# Patient Record
Sex: Male | Born: 1935 | Hispanic: No | Marital: Single
Health system: Southern US, Community
[De-identification: ages and names within clinical notes are randomized; demographics above are authoritative.]

---

## 2017-04-21 ENCOUNTER — Inpatient Hospital Stay
Admission: RE | Admit: 2017-04-21 | Discharge: 2017-05-28 | Disposition: A | Discharge status: Skilled Nursing Facility | Payer: Medicaid Other | Source: Ambulatory Visit | Attending: Internal Medicine | Admitting: Internal Medicine

## 2017-04-21 ENCOUNTER — Other Ambulatory Visit (HOSPITAL_COMMUNITY): Payer: Medicaid Other

## 2017-04-21 DIAGNOSIS — K567 Ileus, unspecified: Secondary | ICD-10-CM

## 2017-04-21 DIAGNOSIS — R0603 Acute respiratory distress: Secondary | ICD-10-CM

## 2017-04-21 DIAGNOSIS — R1110 Vomiting, unspecified: Secondary | ICD-10-CM

## 2017-04-21 DIAGNOSIS — Z431 Encounter for attention to gastrostomy: Secondary | ICD-10-CM

## 2017-04-21 DIAGNOSIS — Z931 Gastrostomy status: Secondary | ICD-10-CM

## 2017-04-21 DIAGNOSIS — Z0189 Encounter for other specified special examinations: Secondary | ICD-10-CM

## 2017-04-21 DIAGNOSIS — D72829 Elevated white blood cell count, unspecified: Secondary | ICD-10-CM

## 2017-04-21 DIAGNOSIS — J9690 Respiratory failure, unspecified, unspecified whether with hypoxia or hypercapnia: Secondary | ICD-10-CM

## 2017-04-21 LAB — BLOOD GAS, ARTERIAL
ACID-BASE EXCESS: 0 mmol/L (ref 0.0–2.0)
Bicarbonate: 24.6 mmol/L (ref 20.0–28.0)
FIO2: 40
LHR: 16 {breaths}/min
O2 SAT: 98.7 %
PATIENT TEMPERATURE: 98.6
PCO2 ART: 42.9 mmHg (ref 32.0–48.0)
PEEP: 5 cmH2O
PH ART: 7.376 (ref 7.350–7.450)
Pressure control: 20 cmH2O
pO2, Arterial: 126 mmHg — ABNORMAL HIGH (ref 83.0–108.0)

## 2017-04-22 LAB — CBC WITH DIFFERENTIAL/PLATELET
BASOS PCT: 0 %
Basophils Absolute: 0 10*3/uL (ref 0.0–0.1)
EOS ABS: 0 10*3/uL (ref 0.0–0.7)
EOS PCT: 0 %
HCT: 29.5 % — ABNORMAL LOW (ref 39.0–52.0)
HEMOGLOBIN: 8.9 g/dL — AB (ref 13.0–17.0)
LYMPHS PCT: 14 %
Lymphs Abs: 1.7 10*3/uL (ref 0.7–4.0)
MCH: 24.9 pg — AB (ref 26.0–34.0)
MCHC: 30.2 g/dL (ref 30.0–36.0)
MCV: 82.4 fL (ref 78.0–100.0)
Monocytes Absolute: 0.6 10*3/uL (ref 0.1–1.0)
Monocytes Relative: 5 %
NEUTROS ABS: 9.7 10*3/uL — AB (ref 1.7–7.7)
NEUTROS PCT: 81 %
Platelets: 334 10*3/uL (ref 150–400)
RBC: 3.58 MIL/uL — ABNORMAL LOW (ref 4.22–5.81)
RDW: 20 % — ABNORMAL HIGH (ref 11.5–15.5)
WBC: 12 10*3/uL — ABNORMAL HIGH (ref 4.0–10.5)

## 2017-04-22 LAB — COMPREHENSIVE METABOLIC PANEL
ALBUMIN: 2.1 g/dL — AB (ref 3.5–5.0)
ALK PHOS: 75 U/L (ref 38–126)
ALT: 16 U/L — AB (ref 17–63)
AST: 16 U/L (ref 15–41)
Anion gap: 7 (ref 5–15)
BILIRUBIN TOTAL: 0.8 mg/dL (ref 0.3–1.2)
BUN: 36 mg/dL — AB (ref 6–20)
CALCIUM: 8.5 mg/dL — AB (ref 8.9–10.3)
CO2: 23 mmol/L (ref 22–32)
CREATININE: 1.08 mg/dL (ref 0.61–1.24)
Chloride: 104 mmol/L (ref 101–111)
GFR calc Af Amer: >60 mL/min (ref >60)
GLUCOSE: 110 mg/dL — AB (ref 65–99)
Potassium: 4.8 mmol/L (ref 3.5–5.1)
Sodium: 134 mmol/L — ABNORMAL LOW (ref 135–145)
TOTAL PROTEIN: 5.9 g/dL — AB (ref 6.5–8.1)

## 2017-04-22 LAB — TSH: TSH: 0.912 u[IU]/mL (ref 0.350–4.500)

## 2017-04-22 LAB — HEMOGLOBIN A1C
HEMOGLOBIN A1C: 5.9 % — AB (ref 4.8–5.6)
MEAN PLASMA GLUCOSE: 122.63 mg/dL

## 2017-04-22 LAB — PROTIME-INR
INR: 1.27
Prothrombin Time: 15.8 seconds — ABNORMAL HIGH (ref 11.4–15.2)

## 2017-04-22 LAB — PHOSPHORUS: Phosphorus: 4.4 mg/dL (ref 2.5–4.6)

## 2017-04-22 LAB — MAGNESIUM: MAGNESIUM: 2.2 mg/dL (ref 1.7–2.4)

## 2017-04-22 MED ORDER — SODIUM CHLORIDE 0.9 % IV SOLN
INTRAVENOUS | Status: DC
Start: ? — End: 2017-04-22

## 2017-04-22 MED ORDER — HYDRALAZINE HCL 20 MG/ML IJ SOLN
10.00 | INTRAMUSCULAR | Status: DC
Start: ? — End: 2017-04-22

## 2017-04-22 MED ORDER — ALBUTEROL SULFATE (2.5 MG/3ML) 0.083% IN NEBU
2.50 | INHALATION_SOLUTION | RESPIRATORY_TRACT | Status: DC
Start: ? — End: 2017-04-22

## 2017-04-22 MED ORDER — FERROUS SULFATE 300 (60 FE) MG/5ML PO SYRP
300.00 | ORAL_SOLUTION | ORAL | Status: DC
Start: 2017-04-21 — End: 2017-04-22

## 2017-04-22 MED ORDER — CHLORHEXIDINE GLUCONATE 0.12 % MT SOLN
15.00 | OROMUCOSAL | Status: DC
Start: 2017-04-21 — End: 2017-04-22

## 2017-04-22 MED ORDER — CLOTRIMAZOLE 1 % EX CREA
TOPICAL_CREAM | CUTANEOUS | Status: DC
Start: 2017-04-21 — End: 2017-04-22

## 2017-04-22 MED ORDER — BISACODYL 10 MG RE SUPP
10.00 | RECTAL | Status: DC
Start: 2017-04-22 — End: 2017-04-22

## 2017-04-22 MED ORDER — DEXTROSE 10 % IV SOLN
INTRAVENOUS | Status: DC
Start: ? — End: 2017-04-22

## 2017-04-22 MED ORDER — LABETALOL HCL 5 MG/ML IV SOLN
10.00 | INTRAVENOUS | Status: DC
Start: ? — End: 2017-04-22

## 2017-04-22 MED ORDER — GENERIC EXTERNAL MEDICATION
Status: DC
Start: ? — End: 2017-04-22

## 2017-04-22 MED ORDER — ONDANSETRON HCL 4 MG/2ML IJ SOLN
4.00 | INTRAMUSCULAR | Status: DC
Start: ? — End: 2017-04-22

## 2017-04-22 MED ORDER — DOCUSATE SODIUM 150 MG/15ML PO LIQD
100.00 | ORAL | Status: DC
Start: 2017-04-22 — End: 2017-04-22

## 2017-04-22 MED ORDER — AMIODARONE HCL 200 MG PO TABS
200.00 | ORAL_TABLET | ORAL | Status: DC
Start: 2017-04-22 — End: 2017-04-22

## 2017-04-22 MED ORDER — DEXMEDETOMIDINE HCL IN NACL 400 MCG/100ML IV SOLN
INTRAVENOUS | Status: DC
Start: ? — End: 2017-04-22

## 2017-04-22 MED ORDER — LORAZEPAM 2 MG/ML IJ SOLN
1.00 | INTRAMUSCULAR | Status: DC
Start: ? — End: 2017-04-22

## 2017-04-22 MED ORDER — NICOTINE 21 MG/24HR TD PT24
MEDICATED_PATCH | TRANSDERMAL | Status: DC
Start: 2017-04-22 — End: 2017-04-22

## 2017-04-22 MED ORDER — INSULIN LISPRO 100 UNIT/ML ~~LOC~~ SOLN
SUBCUTANEOUS | Status: DC
Start: ? — End: 2017-04-22

## 2017-04-22 MED ORDER — FAMOTIDINE 20 MG PO TABS
20.00 | ORAL_TABLET | ORAL | Status: DC
Start: 2017-04-21 — End: 2017-04-22

## 2017-04-22 MED ORDER — INSULIN LISPRO 100 UNIT/ML ~~LOC~~ SOLN
SUBCUTANEOUS | Status: DC
Start: 2017-04-21 — End: 2017-04-22

## 2017-04-22 MED ORDER — EZETIMIBE 10 MG PO TABS
10.00 | ORAL_TABLET | ORAL | Status: DC
Start: 2017-04-21 — End: 2017-04-22

## 2017-04-22 MED ORDER — GUAIFENESIN 100 MG/5ML PO LIQD
200.00 | ORAL | Status: DC
Start: 2017-04-21 — End: 2017-04-22

## 2017-04-22 MED ORDER — FENTANYL CITRATE-NACL 2.5-0.9 MG/250ML-% IV SOLN
INTRAVENOUS | Status: DC
Start: ? — End: 2017-04-22

## 2017-04-22 MED ORDER — ASPIRIN 81 MG PO CHEW
81.00 | CHEWABLE_TABLET | ORAL | Status: DC
Start: 2017-04-22 — End: 2017-04-22

## 2017-04-22 MED ORDER — VITAMIN B-12 1000 MCG PO TABS
ORAL_TABLET | ORAL | Status: DC
Start: 2017-04-22 — End: 2017-04-22

## 2017-04-22 MED ORDER — ACETAMINOPHEN 160 MG/5ML PO SUSP
650.00 | ORAL | Status: DC
Start: ? — End: 2017-04-22

## 2017-04-22 MED ORDER — INSULIN GLARGINE 100 UNIT/ML ~~LOC~~ SOLN
SUBCUTANEOUS | Status: DC
Start: 2017-04-21 — End: 2017-04-22

## 2017-04-23 LAB — URINALYSIS, ROUTINE W REFLEX MICROSCOPIC
BILIRUBIN URINE: NEGATIVE
GLUCOSE, UA: NEGATIVE mg/dL
HGB URINE DIPSTICK: NEGATIVE
Ketones, ur: NEGATIVE mg/dL
LEUKOCYTES UA: NEGATIVE
NITRITE: NEGATIVE
PROTEIN: 100 mg/dL — AB
Specific Gravity, Urine: 1.017 (ref 1.005–1.030)
pH: 5 (ref 5.0–8.0)

## 2017-04-23 LAB — BASIC METABOLIC PANEL
ANION GAP: 9 (ref 5–15)
BUN: 30 mg/dL — ABNORMAL HIGH (ref 6–20)
CO2: 27 mmol/L (ref 22–32)
Calcium: 9 mg/dL (ref 8.9–10.3)
Chloride: 103 mmol/L (ref 101–111)
Creatinine, Ser: 0.99 mg/dL (ref 0.61–1.24)
GFR calc non Af Amer: >60 mL/min (ref >60)
GLUCOSE: 172 mg/dL — AB (ref 65–99)
POTASSIUM: 4.7 mmol/L (ref 3.5–5.1)
Sodium: 139 mmol/L (ref 135–145)

## 2017-04-23 LAB — CBC
HEMATOCRIT: 33.8 % — AB (ref 39.0–52.0)
HEMOGLOBIN: 10.2 g/dL — AB (ref 13.0–17.0)
MCH: 25.3 pg — ABNORMAL LOW (ref 26.0–34.0)
MCHC: 30.2 g/dL (ref 30.0–36.0)
MCV: 83.9 fL (ref 78.0–100.0)
Platelets: 416 10*3/uL — ABNORMAL HIGH (ref 150–400)
RBC: 4.03 MIL/uL — AB (ref 4.22–5.81)
RDW: 19.9 % — ABNORMAL HIGH (ref 11.5–15.5)
WBC: 19.5 10*3/uL — AB (ref 4.0–10.5)

## 2017-04-23 LAB — MAGNESIUM: Magnesium: 2.1 mg/dL (ref 1.7–2.4)

## 2017-04-23 LAB — EXPECTORATED SPUTUM ASSESSMENT W REFEX TO RESP CULTURE

## 2017-04-23 LAB — PHOSPHORUS: PHOSPHORUS: 4.2 mg/dL (ref 2.5–4.6)

## 2017-04-23 LAB — EXPECTORATED SPUTUM ASSESSMENT W GRAM STAIN, RFLX TO RESP C

## 2017-04-24 LAB — C DIFFICILE QUICK SCREEN W PCR REFLEX
C DIFFICILE (CDIFF) INTERP: NOT DETECTED
C DIFFICILE (CDIFF) TOXIN: NEGATIVE
C Diff antigen: NEGATIVE

## 2017-04-24 LAB — BASIC METABOLIC PANEL
ANION GAP: 8 (ref 5–15)
BUN: 24 mg/dL — AB (ref 6–20)
CHLORIDE: 102 mmol/L (ref 101–111)
CO2: 30 mmol/L (ref 22–32)
Calcium: 9.2 mg/dL (ref 8.9–10.3)
Creatinine, Ser: 0.94 mg/dL (ref 0.61–1.24)
GFR calc Af Amer: >60 mL/min (ref >60)
Glucose, Bld: 161 mg/dL — ABNORMAL HIGH (ref 65–99)
Potassium: 4.3 mmol/L (ref 3.5–5.1)
SODIUM: 140 mmol/L (ref 135–145)

## 2017-04-24 LAB — CBC
HCT: 30.4 % — ABNORMAL LOW (ref 39.0–52.0)
HEMOGLOBIN: 8.9 g/dL — AB (ref 13.0–17.0)
MCH: 24.5 pg — AB (ref 26.0–34.0)
MCHC: 29.3 g/dL — ABNORMAL LOW (ref 30.0–36.0)
MCV: 83.7 fL (ref 78.0–100.0)
PLATELETS: 336 10*3/uL (ref 150–400)
RBC: 3.63 MIL/uL — AB (ref 4.22–5.81)
RDW: 19.3 % — ABNORMAL HIGH (ref 11.5–15.5)
WBC: 19 10*3/uL — AB (ref 4.0–10.5)

## 2017-04-24 LAB — URINE CULTURE: Culture: NO GROWTH

## 2017-04-24 LAB — PHOSPHORUS: Phosphorus: 2.5 mg/dL (ref 2.5–4.6)

## 2017-04-24 LAB — MAGNESIUM: MAGNESIUM: 2 mg/dL (ref 1.7–2.4)

## 2017-04-25 LAB — CBC
HEMATOCRIT: 28.7 % — AB (ref 39.0–52.0)
HEMOGLOBIN: 8.6 g/dL — AB (ref 13.0–17.0)
MCH: 25.2 pg — AB (ref 26.0–34.0)
MCHC: 30 g/dL (ref 30.0–36.0)
MCV: 84.2 fL (ref 78.0–100.0)
Platelets: 257 10*3/uL (ref 150–400)
RBC: 3.41 MIL/uL — ABNORMAL LOW (ref 4.22–5.81)
RDW: 19.4 % — AB (ref 11.5–15.5)
WBC: 16.5 10*3/uL — ABNORMAL HIGH (ref 4.0–10.5)

## 2017-04-25 LAB — BLOOD CULTURE ID PANEL (REFLEXED)
Acinetobacter baumannii: NOT DETECTED
CANDIDA GLABRATA: NOT DETECTED
CANDIDA KRUSEI: NOT DETECTED
CANDIDA PARAPSILOSIS: NOT DETECTED
Candida albicans: NOT DETECTED
Candida tropicalis: NOT DETECTED
ENTEROBACTER CLOACAE COMPLEX: NOT DETECTED
ESCHERICHIA COLI: NOT DETECTED
Enterobacteriaceae species: NOT DETECTED
Enterococcus species: NOT DETECTED
Haemophilus influenzae: NOT DETECTED
KLEBSIELLA OXYTOCA: NOT DETECTED
Klebsiella pneumoniae: NOT DETECTED
LISTERIA MONOCYTOGENES: NOT DETECTED
Neisseria meningitidis: NOT DETECTED
PROTEUS SPECIES: NOT DETECTED
PSEUDOMONAS AERUGINOSA: NOT DETECTED
SERRATIA MARCESCENS: NOT DETECTED
STAPHYLOCOCCUS AUREUS BCID: NOT DETECTED
STREPTOCOCCUS PNEUMONIAE: NOT DETECTED
STREPTOCOCCUS PYOGENES: NOT DETECTED
Staphylococcus species: NOT DETECTED
Streptococcus agalactiae: NOT DETECTED
Streptococcus species: NOT DETECTED

## 2017-04-25 LAB — BASIC METABOLIC PANEL
Anion gap: 7 (ref 5–15)
BUN: 21 mg/dL — ABNORMAL HIGH (ref 6–20)
CALCIUM: 8.4 mg/dL — AB (ref 8.9–10.3)
CHLORIDE: 103 mmol/L (ref 101–111)
CO2: 29 mmol/L (ref 22–32)
CREATININE: 0.86 mg/dL (ref 0.61–1.24)
GFR calc non Af Amer: >60 mL/min (ref >60)
Glucose, Bld: 127 mg/dL — ABNORMAL HIGH (ref 65–99)
Potassium: 3.7 mmol/L (ref 3.5–5.1)
Sodium: 139 mmol/L (ref 135–145)

## 2017-04-25 LAB — CULTURE, RESPIRATORY W GRAM STAIN

## 2017-04-25 LAB — PHOSPHORUS: PHOSPHORUS: 2.2 mg/dL — AB (ref 2.5–4.6)

## 2017-04-25 LAB — CULTURE, RESPIRATORY

## 2017-04-25 LAB — MAGNESIUM: Magnesium: 1.9 mg/dL (ref 1.7–2.4)

## 2017-04-26 LAB — CBC WITH DIFFERENTIAL/PLATELET
Basophils Absolute: 0 10*3/uL (ref 0.0–0.1)
Basophils Relative: 0 %
EOS ABS: 0.2 10*3/uL (ref 0.0–0.7)
Eosinophils Relative: 2 %
HEMATOCRIT: 31.2 % — AB (ref 39.0–52.0)
HEMOGLOBIN: 9.3 g/dL — AB (ref 13.0–17.0)
LYMPHS PCT: 7 %
Lymphs Abs: 1.1 10*3/uL (ref 0.7–4.0)
MCH: 24.9 pg — AB (ref 26.0–34.0)
MCHC: 29.8 g/dL — ABNORMAL LOW (ref 30.0–36.0)
MCV: 83.6 fL (ref 78.0–100.0)
Monocytes Absolute: 1.4 10*3/uL — ABNORMAL HIGH (ref 0.1–1.0)
Monocytes Relative: 9 %
NEUTROS ABS: 12.4 10*3/uL — AB (ref 1.7–7.7)
NEUTROS PCT: 82 %
Platelets: 275 10*3/uL (ref 150–400)
RBC: 3.73 MIL/uL — AB (ref 4.22–5.81)
RDW: 19.3 % — ABNORMAL HIGH (ref 11.5–15.5)
WBC: 15.2 10*3/uL — AB (ref 4.0–10.5)

## 2017-04-26 LAB — BASIC METABOLIC PANEL
Anion gap: 11 (ref 5–15)
BUN: 19 mg/dL (ref 6–20)
CHLORIDE: 101 mmol/L (ref 101–111)
CO2: 28 mmol/L (ref 22–32)
Calcium: 8.9 mg/dL (ref 8.9–10.3)
Creatinine, Ser: 0.92 mg/dL (ref 0.61–1.24)
GFR calc Af Amer: >60 mL/min (ref >60)
GFR calc non Af Amer: >60 mL/min (ref >60)
Glucose, Bld: 128 mg/dL — ABNORMAL HIGH (ref 65–99)
POTASSIUM: 4.2 mmol/L (ref 3.5–5.1)
SODIUM: 140 mmol/L (ref 135–145)

## 2017-04-26 LAB — MAGNESIUM: MAGNESIUM: 1.9 mg/dL (ref 1.7–2.4)

## 2017-04-27 LAB — CULTURE, BLOOD (ROUTINE X 2): SPECIAL REQUESTS: ADEQUATE

## 2017-04-27 LAB — VANCOMYCIN, TROUGH: Vancomycin Tr: 10 ug/mL — ABNORMAL LOW (ref 15–20)

## 2017-04-29 LAB — CULTURE, BLOOD (ROUTINE X 2)
CULTURE: NO GROWTH
SPECIAL REQUESTS: ADEQUATE

## 2017-04-30 LAB — VANCOMYCIN, TROUGH: VANCOMYCIN TR: 21 ug/mL — AB (ref 15–20)

## 2017-05-01 LAB — BASIC METABOLIC PANEL
Anion gap: 15 (ref 5–15)
BUN: 27 mg/dL — AB (ref 6–20)
CHLORIDE: 95 mmol/L — AB (ref 101–111)
CO2: 29 mmol/L (ref 22–32)
CREATININE: 1.18 mg/dL (ref 0.61–1.24)
Calcium: 8.8 mg/dL — ABNORMAL LOW (ref 8.9–10.3)
GFR, EST NON AFRICAN AMERICAN: 57 mL/min — AB (ref >60)
Glucose, Bld: 110 mg/dL — ABNORMAL HIGH (ref 65–99)
POTASSIUM: 4.1 mmol/L (ref 3.5–5.1)
SODIUM: 139 mmol/L (ref 135–145)

## 2017-05-01 LAB — CBC
HCT: 33.1 % — ABNORMAL LOW (ref 39.0–52.0)
HEMOGLOBIN: 10.1 g/dL — AB (ref 13.0–17.0)
MCH: 25.1 pg — AB (ref 26.0–34.0)
MCHC: 30.5 g/dL (ref 30.0–36.0)
MCV: 82.3 fL (ref 78.0–100.0)
Platelets: 374 10*3/uL (ref 150–400)
RBC: 4.02 MIL/uL — AB (ref 4.22–5.81)
RDW: 18.3 % — ABNORMAL HIGH (ref 11.5–15.5)
WBC: 14.4 10*3/uL — ABNORMAL HIGH (ref 4.0–10.5)

## 2017-05-03 LAB — BASIC METABOLIC PANEL
Anion gap: 13 (ref 5–15)
BUN: 25 mg/dL — ABNORMAL HIGH (ref 6–20)
CALCIUM: 8.7 mg/dL — AB (ref 8.9–10.3)
CO2: 31 mmol/L (ref 22–32)
CREATININE: 1.26 mg/dL — AB (ref 0.61–1.24)
Chloride: 95 mmol/L — ABNORMAL LOW (ref 101–111)
GFR, EST NON AFRICAN AMERICAN: 52 mL/min — AB (ref >60)
GLUCOSE: 133 mg/dL — AB (ref 65–99)
Potassium: 3.9 mmol/L (ref 3.5–5.1)
Sodium: 139 mmol/L (ref 135–145)

## 2017-05-04 LAB — VANCOMYCIN, TROUGH: VANCOMYCIN TR: 36 ug/mL — AB (ref 15–20)

## 2017-05-05 LAB — CBC
HCT: 31 % — ABNORMAL LOW (ref 39.0–52.0)
Hemoglobin: 9.1 g/dL — ABNORMAL LOW (ref 13.0–17.0)
MCH: 24.5 pg — AB (ref 26.0–34.0)
MCHC: 29.4 g/dL — AB (ref 30.0–36.0)
MCV: 83.3 fL (ref 78.0–100.0)
PLATELETS: 563 10*3/uL — AB (ref 150–400)
RBC: 3.72 MIL/uL — ABNORMAL LOW (ref 4.22–5.81)
RDW: 18.3 % — ABNORMAL HIGH (ref 11.5–15.5)
WBC: 14.6 10*3/uL — ABNORMAL HIGH (ref 4.0–10.5)

## 2017-05-05 LAB — RENAL FUNCTION PANEL
Albumin: 2.5 g/dL — ABNORMAL LOW (ref 3.5–5.0)
Anion gap: 12 (ref 5–15)
BUN: 34 mg/dL — AB (ref 6–20)
CALCIUM: 8.7 mg/dL — AB (ref 8.9–10.3)
CO2: 30 mmol/L (ref 22–32)
CREATININE: 1.49 mg/dL — AB (ref 0.61–1.24)
Chloride: 95 mmol/L — ABNORMAL LOW (ref 101–111)
GFR calc non Af Amer: 43 mL/min — ABNORMAL LOW (ref >60)
GFR, EST AFRICAN AMERICAN: 50 mL/min — AB (ref >60)
GLUCOSE: 119 mg/dL — AB (ref 65–99)
Phosphorus: 3.7 mg/dL (ref 2.5–4.6)
Potassium: 3.6 mmol/L (ref 3.5–5.1)
SODIUM: 137 mmol/L (ref 135–145)

## 2017-05-05 LAB — VANCOMYCIN, TROUGH: Vancomycin Tr: 21 ug/mL (ref 15–20)

## 2017-05-05 LAB — MAGNESIUM: Magnesium: 2.5 mg/dL — ABNORMAL HIGH (ref 1.7–2.4)

## 2017-05-06 LAB — CBC
HEMATOCRIT: 28.1 % — AB (ref 39.0–52.0)
HEMOGLOBIN: 8.7 g/dL — AB (ref 13.0–17.0)
MCH: 25.9 pg — ABNORMAL LOW (ref 26.0–34.0)
MCHC: 31 g/dL (ref 30.0–36.0)
MCV: 83.6 fL (ref 78.0–100.0)
Platelets: 592 10*3/uL — ABNORMAL HIGH (ref 150–400)
RBC: 3.36 MIL/uL — ABNORMAL LOW (ref 4.22–5.81)
RDW: 18.5 % — AB (ref 11.5–15.5)
WBC: 14.8 10*3/uL — ABNORMAL HIGH (ref 4.0–10.5)

## 2017-05-06 LAB — RENAL FUNCTION PANEL
ANION GAP: 12 (ref 5–15)
Albumin: 2.3 g/dL — ABNORMAL LOW (ref 3.5–5.0)
BUN: 33 mg/dL — ABNORMAL HIGH (ref 6–20)
CALCIUM: 9 mg/dL (ref 8.9–10.3)
CO2: 31 mmol/L (ref 22–32)
Chloride: 97 mmol/L — ABNORMAL LOW (ref 101–111)
Creatinine, Ser: 1.39 mg/dL — ABNORMAL HIGH (ref 0.61–1.24)
GFR calc non Af Amer: 47 mL/min — ABNORMAL LOW (ref >60)
GFR, EST AFRICAN AMERICAN: 54 mL/min — AB (ref >60)
GLUCOSE: 148 mg/dL — AB (ref 65–99)
POTASSIUM: 3.7 mmol/L (ref 3.5–5.1)
Phosphorus: 3.7 mg/dL (ref 2.5–4.6)
SODIUM: 140 mmol/L (ref 135–145)

## 2017-05-06 LAB — MAGNESIUM: Magnesium: 2.2 mg/dL (ref 1.7–2.4)

## 2017-05-08 LAB — RENAL FUNCTION PANEL
ANION GAP: 12 (ref 5–15)
Albumin: 2.3 g/dL — ABNORMAL LOW (ref 3.5–5.0)
BUN: 29 mg/dL — ABNORMAL HIGH (ref 6–20)
CALCIUM: 9.2 mg/dL (ref 8.9–10.3)
CO2: 31 mmol/L (ref 22–32)
Chloride: 94 mmol/L — ABNORMAL LOW (ref 101–111)
Creatinine, Ser: 1.31 mg/dL — ABNORMAL HIGH (ref 0.61–1.24)
GFR calc non Af Amer: 50 mL/min — ABNORMAL LOW (ref >60)
GFR, EST AFRICAN AMERICAN: 58 mL/min — AB (ref >60)
Glucose, Bld: 161 mg/dL — ABNORMAL HIGH (ref 65–99)
POTASSIUM: 3.3 mmol/L — AB (ref 3.5–5.1)
Phosphorus: 4.2 mg/dL (ref 2.5–4.6)
SODIUM: 137 mmol/L (ref 135–145)

## 2017-05-08 LAB — CBC
HEMATOCRIT: 32.2 % — AB (ref 39.0–52.0)
HEMOGLOBIN: 9.6 g/dL — AB (ref 13.0–17.0)
MCH: 24.9 pg — ABNORMAL LOW (ref 26.0–34.0)
MCHC: 29.8 g/dL — AB (ref 30.0–36.0)
MCV: 83.6 fL (ref 78.0–100.0)
Platelets: 659 10*3/uL — ABNORMAL HIGH (ref 150–400)
RBC: 3.85 MIL/uL — ABNORMAL LOW (ref 4.22–5.81)
RDW: 18.8 % — ABNORMAL HIGH (ref 11.5–15.5)
WBC: 15 10*3/uL — ABNORMAL HIGH (ref 4.0–10.5)

## 2017-05-08 LAB — MAGNESIUM: Magnesium: 2.1 mg/dL (ref 1.7–2.4)

## 2017-05-09 LAB — RENAL FUNCTION PANEL
ALBUMIN: 2.3 g/dL — AB (ref 3.5–5.0)
Anion gap: 13 (ref 5–15)
BUN: 28 mg/dL — ABNORMAL HIGH (ref 6–20)
CO2: 30 mmol/L (ref 22–32)
CREATININE: 1.35 mg/dL — AB (ref 0.61–1.24)
Calcium: 9.2 mg/dL (ref 8.9–10.3)
Chloride: 95 mmol/L — ABNORMAL LOW (ref 101–111)
GFR calc Af Amer: 56 mL/min — ABNORMAL LOW (ref >60)
GFR, EST NON AFRICAN AMERICAN: 48 mL/min — AB (ref >60)
GLUCOSE: 139 mg/dL — AB (ref 65–99)
PHOSPHORUS: 3.7 mg/dL (ref 2.5–4.6)
POTASSIUM: 3.9 mmol/L (ref 3.5–5.1)
Sodium: 138 mmol/L (ref 135–145)

## 2017-05-09 LAB — CULTURE, RESPIRATORY W GRAM STAIN: Special Requests: NORMAL

## 2017-05-09 LAB — CULTURE, RESPIRATORY

## 2017-05-10 ENCOUNTER — Other Ambulatory Visit (HOSPITAL_COMMUNITY): Payer: Medicaid Other

## 2017-05-10 MED ORDER — GENERIC EXTERNAL MEDICATION
Status: DC
Start: ? — End: 2017-05-10

## 2017-05-10 MED ORDER — DIATRIZOATE MEGLUMINE & SODIUM 66-10 % PO SOLN
ORAL | Status: AC
  Filled 2017-05-10: qty 30

## 2017-05-10 MED ORDER — IOPAMIDOL (ISOVUE-300) INJECTION 61%
INTRAVENOUS | Status: AC
  Filled 2017-05-10: qty 50

## 2017-05-11 ENCOUNTER — Other Ambulatory Visit (HOSPITAL_COMMUNITY): Payer: Medicaid Other

## 2017-05-11 ENCOUNTER — Encounter (HOSPITAL_COMMUNITY): Payer: Self-pay | Admitting: General Surgery

## 2017-05-11 HISTORY — PX: IR REPLACE G-TUBE SIMPLE WO FLUORO: IMG2323

## 2017-05-11 LAB — RENAL FUNCTION PANEL
ALBUMIN: 2.2 g/dL — AB (ref 3.5–5.0)
Anion gap: 11 (ref 5–15)
BUN: 29 mg/dL — AB (ref 6–20)
CALCIUM: 9.3 mg/dL (ref 8.9–10.3)
CO2: 31 mmol/L (ref 22–32)
Chloride: 96 mmol/L — ABNORMAL LOW (ref 101–111)
Creatinine, Ser: 1.41 mg/dL — ABNORMAL HIGH (ref 0.61–1.24)
GFR calc Af Amer: 53 mL/min — ABNORMAL LOW (ref >60)
GFR, EST NON AFRICAN AMERICAN: 46 mL/min — AB (ref >60)
Glucose, Bld: 152 mg/dL — ABNORMAL HIGH (ref 65–99)
PHOSPHORUS: 3.8 mg/dL (ref 2.5–4.6)
Potassium: 3.8 mmol/L (ref 3.5–5.1)
SODIUM: 138 mmol/L (ref 135–145)

## 2017-05-11 LAB — MAGNESIUM: MAGNESIUM: 2.3 mg/dL (ref 1.7–2.4)

## 2017-05-11 LAB — CBC
HCT: 31.8 % — ABNORMAL LOW (ref 39.0–52.0)
Hemoglobin: 9.4 g/dL — ABNORMAL LOW (ref 13.0–17.0)
MCH: 24.9 pg — ABNORMAL LOW (ref 26.0–34.0)
MCHC: 29.6 g/dL — ABNORMAL LOW (ref 30.0–36.0)
MCV: 84.4 fL (ref 78.0–100.0)
Platelets: 619 10*3/uL — ABNORMAL HIGH (ref 150–400)
RBC: 3.77 MIL/uL — ABNORMAL LOW (ref 4.22–5.81)
RDW: 18.6 % — AB (ref 11.5–15.5)
WBC: 14 10*3/uL — AB (ref 4.0–10.5)

## 2017-05-11 MED ORDER — IOPAMIDOL (ISOVUE-300) INJECTION 61%
INTRAVENOUS | Status: AC
  Filled 2017-05-11: qty 50

## 2017-05-11 MED ORDER — IOPAMIDOL (ISOVUE-300) INJECTION 61%
50.0000 mL | Freq: Once | INTRAVENOUS | Status: AC | PRN
  Administered 2017-05-11: 50 mL

## 2017-05-11 NOTE — Procedures (Signed)
Bedside g-tube replaced with a 70F balloon retention gastrostomy tube.  No complications.  Floor to order KUB to confirm placement.  Letha CapeKelly E Darric Plante 10:59 AM 05/11/2017

## 2017-05-13 LAB — BASIC METABOLIC PANEL
ANION GAP: 10 (ref 5–15)
BUN: 38 mg/dL — ABNORMAL HIGH (ref 6–20)
CALCIUM: 9.3 mg/dL (ref 8.9–10.3)
CO2: 30 mmol/L (ref 22–32)
Chloride: 97 mmol/L — ABNORMAL LOW (ref 101–111)
Creatinine, Ser: 1.66 mg/dL — ABNORMAL HIGH (ref 0.61–1.24)
GFR calc non Af Amer: 38 mL/min — ABNORMAL LOW (ref >60)
GFR, EST AFRICAN AMERICAN: 44 mL/min — AB (ref >60)
Glucose, Bld: 145 mg/dL — ABNORMAL HIGH (ref 65–99)
POTASSIUM: 3.9 mmol/L (ref 3.5–5.1)
Sodium: 137 mmol/L (ref 135–145)

## 2017-05-13 LAB — CBC
HEMATOCRIT: 25.8 % — AB (ref 39.0–52.0)
HEMOGLOBIN: 7.8 g/dL — AB (ref 13.0–17.0)
MCH: 25.3 pg — AB (ref 26.0–34.0)
MCHC: 30.2 g/dL (ref 30.0–36.0)
MCV: 83.8 fL (ref 78.0–100.0)
Platelets: 494 10*3/uL — ABNORMAL HIGH (ref 150–400)
RBC: 3.08 MIL/uL — AB (ref 4.22–5.81)
RDW: 18.5 % — ABNORMAL HIGH (ref 11.5–15.5)
WBC: 13.8 10*3/uL — ABNORMAL HIGH (ref 4.0–10.5)

## 2017-05-14 ENCOUNTER — Other Ambulatory Visit (HOSPITAL_COMMUNITY): Payer: Medicaid Other

## 2017-05-14 LAB — URINALYSIS, ROUTINE W REFLEX MICROSCOPIC
Bilirubin Urine: NEGATIVE
GLUCOSE, UA: NEGATIVE mg/dL
Ketones, ur: NEGATIVE mg/dL
LEUKOCYTES UA: NEGATIVE
NITRITE: NEGATIVE
Protein, ur: NEGATIVE mg/dL
SPECIFIC GRAVITY, URINE: 1.01 (ref 1.005–1.030)
Squamous Epithelial / LPF: NONE SEEN
pH: 5 (ref 5.0–8.0)

## 2017-05-14 LAB — CBC
HEMATOCRIT: 25.9 % — AB (ref 39.0–52.0)
Hemoglobin: 7.7 g/dL — ABNORMAL LOW (ref 13.0–17.0)
MCH: 24.9 pg — ABNORMAL LOW (ref 26.0–34.0)
MCHC: 29.7 g/dL — ABNORMAL LOW (ref 30.0–36.0)
MCV: 83.8 fL (ref 78.0–100.0)
Platelets: 486 10*3/uL — ABNORMAL HIGH (ref 150–400)
RBC: 3.09 MIL/uL — ABNORMAL LOW (ref 4.22–5.81)
RDW: 18.2 % — AB (ref 11.5–15.5)
WBC: 18 10*3/uL — AB (ref 4.0–10.5)

## 2017-05-14 LAB — BLOOD GAS, ARTERIAL
ACID-BASE EXCESS: 4.2 mmol/L — AB (ref 0.0–2.0)
Bicarbonate: 30 mmol/L — ABNORMAL HIGH (ref 20.0–28.0)
FIO2: 100
MECHVT: 450 mL
O2 SAT: 100 %
PCO2 ART: 61.1 mmHg — AB (ref 32.0–48.0)
PEEP: 5 cmH2O
PH ART: 7.313 — AB (ref 7.350–7.450)
PO2 ART: 311 mmHg — AB (ref 83.0–108.0)
Patient temperature: 98.6
RATE: 14 resp/min

## 2017-05-14 LAB — BASIC METABOLIC PANEL
ANION GAP: 11 (ref 5–15)
BUN: 35 mg/dL — ABNORMAL HIGH (ref 6–20)
CALCIUM: 9.3 mg/dL (ref 8.9–10.3)
CO2: 26 mmol/L (ref 22–32)
Chloride: 98 mmol/L — ABNORMAL LOW (ref 101–111)
Creatinine, Ser: 1.34 mg/dL — ABNORMAL HIGH (ref 0.61–1.24)
GFR calc Af Amer: 56 mL/min — ABNORMAL LOW (ref >60)
GFR, EST NON AFRICAN AMERICAN: 49 mL/min — AB (ref >60)
Glucose, Bld: 139 mg/dL — ABNORMAL HIGH (ref 65–99)
Potassium: 4.1 mmol/L (ref 3.5–5.1)
SODIUM: 135 mmol/L (ref 135–145)

## 2017-05-15 ENCOUNTER — Other Ambulatory Visit (HOSPITAL_COMMUNITY): Payer: Medicaid Other

## 2017-05-15 LAB — BASIC METABOLIC PANEL
Anion gap: 10 (ref 5–15)
BUN: 45 mg/dL — AB (ref 6–20)
CALCIUM: 9.5 mg/dL (ref 8.9–10.3)
CO2: 30 mmol/L (ref 22–32)
Chloride: 97 mmol/L — ABNORMAL LOW (ref 101–111)
Creatinine, Ser: 1.74 mg/dL — ABNORMAL HIGH (ref 0.61–1.24)
GFR calc Af Amer: 41 mL/min — ABNORMAL LOW (ref >60)
GFR, EST NON AFRICAN AMERICAN: 36 mL/min — AB (ref >60)
Glucose, Bld: 127 mg/dL — ABNORMAL HIGH (ref 65–99)
Potassium: 4.2 mmol/L (ref 3.5–5.1)
Sodium: 137 mmol/L (ref 135–145)

## 2017-05-15 LAB — CBC
HCT: 25.3 % — ABNORMAL LOW (ref 39.0–52.0)
Hemoglobin: 7.5 g/dL — ABNORMAL LOW (ref 13.0–17.0)
MCH: 25 pg — AB (ref 26.0–34.0)
MCHC: 29.6 g/dL — AB (ref 30.0–36.0)
MCV: 84.3 fL (ref 78.0–100.0)
PLATELETS: 408 10*3/uL — AB (ref 150–400)
RBC: 3 MIL/uL — ABNORMAL LOW (ref 4.22–5.81)
RDW: 18.3 % — AB (ref 11.5–15.5)
WBC: 19 10*3/uL — ABNORMAL HIGH (ref 4.0–10.5)

## 2017-05-15 LAB — MAGNESIUM: Magnesium: 2.3 mg/dL (ref 1.7–2.4)

## 2017-05-16 LAB — URINE CULTURE: CULTURE: NO GROWTH

## 2017-05-17 LAB — CBC
HCT: 22.1 % — ABNORMAL LOW (ref 39.0–52.0)
Hemoglobin: 6.7 g/dL — CL (ref 13.0–17.0)
MCH: 25.5 pg — ABNORMAL LOW (ref 26.0–34.0)
MCHC: 30.3 g/dL (ref 30.0–36.0)
MCV: 84 fL (ref 78.0–100.0)
PLATELETS: 350 10*3/uL (ref 150–400)
RBC: 2.63 MIL/uL — AB (ref 4.22–5.81)
RDW: 18.9 % — ABNORMAL HIGH (ref 11.5–15.5)
WBC: 11.8 10*3/uL — AB (ref 4.0–10.5)

## 2017-05-17 LAB — BASIC METABOLIC PANEL
ANION GAP: 11 (ref 5–15)
BUN: 51 mg/dL — AB (ref 6–20)
CO2: 29 mmol/L (ref 22–32)
Calcium: 9.2 mg/dL (ref 8.9–10.3)
Chloride: 98 mmol/L — ABNORMAL LOW (ref 101–111)
Creatinine, Ser: 1.3 mg/dL — ABNORMAL HIGH (ref 0.61–1.24)
GFR calc Af Amer: 59 mL/min — ABNORMAL LOW (ref >60)
GFR, EST NON AFRICAN AMERICAN: 51 mL/min — AB (ref >60)
Glucose, Bld: 165 mg/dL — ABNORMAL HIGH (ref 65–99)
POTASSIUM: 3.6 mmol/L (ref 3.5–5.1)
SODIUM: 138 mmol/L (ref 135–145)

## 2017-05-17 LAB — PREPARE RBC (CROSSMATCH)

## 2017-05-17 LAB — ABO/RH: ABO/RH(D): A POS

## 2017-05-18 LAB — CBC
HEMATOCRIT: 25.9 % — AB (ref 39.0–52.0)
Hemoglobin: 7.7 g/dL — ABNORMAL LOW (ref 13.0–17.0)
MCH: 24.5 pg — ABNORMAL LOW (ref 26.0–34.0)
MCHC: 29.7 g/dL — ABNORMAL LOW (ref 30.0–36.0)
MCV: 82.5 fL (ref 78.0–100.0)
Platelets: 362 10*3/uL (ref 150–400)
RBC: 3.14 MIL/uL — AB (ref 4.22–5.81)
RDW: 21.2 % — ABNORMAL HIGH (ref 11.5–15.5)
WBC: 13.1 10*3/uL — ABNORMAL HIGH (ref 4.0–10.5)

## 2017-05-18 LAB — TYPE AND SCREEN
ABO/RH(D): A POS
Antibody Screen: NEGATIVE
Unit division: 0

## 2017-05-18 LAB — RENAL FUNCTION PANEL
ANION GAP: 11 (ref 5–15)
Albumin: 2 g/dL — ABNORMAL LOW (ref 3.5–5.0)
BUN: 41 mg/dL — ABNORMAL HIGH (ref 6–20)
CHLORIDE: 99 mmol/L — AB (ref 101–111)
CO2: 30 mmol/L (ref 22–32)
Calcium: 9.1 mg/dL (ref 8.9–10.3)
Creatinine, Ser: 1.17 mg/dL (ref 0.61–1.24)
GFR calc non Af Amer: 57 mL/min — ABNORMAL LOW (ref >60)
Glucose, Bld: 132 mg/dL — ABNORMAL HIGH (ref 65–99)
POTASSIUM: 3.5 mmol/L (ref 3.5–5.1)
Phosphorus: 3 mg/dL (ref 2.5–4.6)
Sodium: 140 mmol/L (ref 135–145)

## 2017-05-18 LAB — BPAM RBC
Blood Product Expiration Date: 201902092359
ISSUE DATE / TIME: 201902031325
UNIT TYPE AND RH: 600

## 2017-05-18 LAB — OCCULT BLOOD X 1 CARD TO LAB, STOOL: Fecal Occult Bld: NEGATIVE

## 2017-05-18 LAB — MAGNESIUM: MAGNESIUM: 2.3 mg/dL (ref 1.7–2.4)

## 2017-05-19 LAB — PROTIME-INR
INR: 1.23
PROTHROMBIN TIME: 15.4 s — AB (ref 11.4–15.2)

## 2017-05-19 LAB — CBC
HCT: 25.8 % — ABNORMAL LOW (ref 39.0–52.0)
Hemoglobin: 7.5 g/dL — ABNORMAL LOW (ref 13.0–17.0)
MCH: 24 pg — AB (ref 26.0–34.0)
MCHC: 29.1 g/dL — AB (ref 30.0–36.0)
MCV: 82.7 fL (ref 78.0–100.0)
PLATELETS: 381 10*3/uL (ref 150–400)
RBC: 3.12 MIL/uL — ABNORMAL LOW (ref 4.22–5.81)
RDW: 20.6 % — ABNORMAL HIGH (ref 11.5–15.5)
WBC: 13.7 10*3/uL — ABNORMAL HIGH (ref 4.0–10.5)

## 2017-05-19 LAB — CULTURE, BLOOD (ROUTINE X 2)
CULTURE: NO GROWTH
Culture: NO GROWTH
SPECIAL REQUESTS: ADEQUATE
Special Requests: ADEQUATE

## 2017-05-19 LAB — RENAL FUNCTION PANEL
ALBUMIN: 2.1 g/dL — AB (ref 3.5–5.0)
Anion gap: 12 (ref 5–15)
BUN: 36 mg/dL — ABNORMAL HIGH (ref 6–20)
CALCIUM: 9.2 mg/dL (ref 8.9–10.3)
CO2: 31 mmol/L (ref 22–32)
CREATININE: 1.06 mg/dL (ref 0.61–1.24)
Chloride: 97 mmol/L — ABNORMAL LOW (ref 101–111)
GFR calc Af Amer: >60 mL/min (ref >60)
GFR calc non Af Amer: >60 mL/min (ref >60)
GLUCOSE: 162 mg/dL — AB (ref 65–99)
PHOSPHORUS: 3 mg/dL (ref 2.5–4.6)
Potassium: 3.7 mmol/L (ref 3.5–5.1)
SODIUM: 140 mmol/L (ref 135–145)

## 2017-05-19 LAB — MAGNESIUM: Magnesium: 2.1 mg/dL (ref 1.7–2.4)

## 2017-05-20 LAB — RENAL FUNCTION PANEL
Albumin: 2.3 g/dL — ABNORMAL LOW (ref 3.5–5.0)
Anion gap: 14 (ref 5–15)
BUN: 33 mg/dL — AB (ref 6–20)
CHLORIDE: 97 mmol/L — AB (ref 101–111)
CO2: 32 mmol/L (ref 22–32)
CREATININE: 1.08 mg/dL (ref 0.61–1.24)
Calcium: 9.3 mg/dL (ref 8.9–10.3)
GFR calc Af Amer: >60 mL/min (ref >60)
GFR calc non Af Amer: >60 mL/min (ref >60)
GLUCOSE: 119 mg/dL — AB (ref 65–99)
Phosphorus: 3.3 mg/dL (ref 2.5–4.6)
Potassium: 4.1 mmol/L (ref 3.5–5.1)
SODIUM: 143 mmol/L (ref 135–145)

## 2017-05-20 LAB — URINALYSIS, ROUTINE W REFLEX MICROSCOPIC
Bilirubin Urine: NEGATIVE
GLUCOSE, UA: NEGATIVE mg/dL
Ketones, ur: NEGATIVE mg/dL
Nitrite: NEGATIVE
PROTEIN: 100 mg/dL — AB
SPECIFIC GRAVITY, URINE: 1.02 (ref 1.005–1.030)
pH: 5 (ref 5.0–8.0)

## 2017-05-20 LAB — CBC
HCT: 25.5 % — ABNORMAL LOW (ref 39.0–52.0)
Hemoglobin: 7.5 g/dL — ABNORMAL LOW (ref 13.0–17.0)
MCH: 24.7 pg — AB (ref 26.0–34.0)
MCHC: 29.4 g/dL — ABNORMAL LOW (ref 30.0–36.0)
MCV: 83.9 fL (ref 78.0–100.0)
PLATELETS: 369 10*3/uL (ref 150–400)
RBC: 3.04 MIL/uL — ABNORMAL LOW (ref 4.22–5.81)
RDW: 20.5 % — ABNORMAL HIGH (ref 11.5–15.5)
WBC: 12.5 10*3/uL — ABNORMAL HIGH (ref 4.0–10.5)

## 2017-05-20 LAB — BLOOD GAS, ARTERIAL
Acid-Base Excess: 4 mmol/L — ABNORMAL HIGH (ref 0.0–2.0)
BICARBONATE: 31.1 mmol/L — AB (ref 20.0–28.0)
FIO2: 80
PCO2 ART: 77.1 mmHg — AB (ref 32.0–48.0)
PO2 ART: 89.7 mmHg (ref 83.0–108.0)
pH, Arterial: 7.229 — ABNORMAL LOW (ref 7.350–7.450)

## 2017-05-20 LAB — MAGNESIUM: Magnesium: 2 mg/dL (ref 1.7–2.4)

## 2017-05-21 ENCOUNTER — Other Ambulatory Visit (HOSPITAL_COMMUNITY): Payer: Medicaid Other

## 2017-05-21 LAB — ACID FAST SMEAR (AFB, MYCOBACTERIA): Acid Fast Smear: NEGATIVE

## 2017-05-21 LAB — CULTURE, RESPIRATORY W GRAM STAIN: Culture: NORMAL

## 2017-05-21 LAB — ACID FAST SMEAR (AFB)

## 2017-05-21 LAB — CULTURE, RESPIRATORY

## 2017-05-21 MED ORDER — GENERIC EXTERNAL MEDICATION
Status: DC
Start: ? — End: 2017-05-21

## 2017-05-22 LAB — URINE CULTURE
CULTURE: NO GROWTH
SPECIAL REQUESTS: NORMAL

## 2017-05-22 LAB — CBC
HCT: 22.9 % — ABNORMAL LOW (ref 39.0–52.0)
HEMOGLOBIN: 6.6 g/dL — AB (ref 13.0–17.0)
MCH: 24.2 pg — AB (ref 26.0–34.0)
MCHC: 28.8 g/dL — ABNORMAL LOW (ref 30.0–36.0)
MCV: 83.9 fL (ref 78.0–100.0)
PLATELETS: 284 10*3/uL (ref 150–400)
RBC: 2.73 MIL/uL — AB (ref 4.22–5.81)
RDW: 20.6 % — ABNORMAL HIGH (ref 11.5–15.5)
WBC: 15.1 10*3/uL — ABNORMAL HIGH (ref 4.0–10.5)

## 2017-05-22 LAB — RENAL FUNCTION PANEL
ANION GAP: 13 (ref 5–15)
Albumin: 2.2 g/dL — ABNORMAL LOW (ref 3.5–5.0)
BUN: 55 mg/dL — ABNORMAL HIGH (ref 6–20)
CHLORIDE: 95 mmol/L — AB (ref 101–111)
CO2: 31 mmol/L (ref 22–32)
Calcium: 8.9 mg/dL (ref 8.9–10.3)
Creatinine, Ser: 1.49 mg/dL — ABNORMAL HIGH (ref 0.61–1.24)
GFR calc non Af Amer: 43 mL/min — ABNORMAL LOW (ref >60)
GFR, EST AFRICAN AMERICAN: 50 mL/min — AB (ref >60)
Glucose, Bld: 142 mg/dL — ABNORMAL HIGH (ref 65–99)
Phosphorus: 3.5 mg/dL (ref 2.5–4.6)
Potassium: 3.6 mmol/L (ref 3.5–5.1)
Sodium: 139 mmol/L (ref 135–145)

## 2017-05-22 LAB — PREPARE RBC (CROSSMATCH)

## 2017-05-22 LAB — MAGNESIUM: Magnesium: 2.4 mg/dL (ref 1.7–2.4)

## 2017-05-23 ENCOUNTER — Other Ambulatory Visit (HOSPITAL_COMMUNITY): Payer: Medicaid Other

## 2017-05-23 LAB — CBC
HCT: 29.9 % — ABNORMAL LOW (ref 39.0–52.0)
Hemoglobin: 8.9 g/dL — ABNORMAL LOW (ref 13.0–17.0)
MCH: 25.4 pg — AB (ref 26.0–34.0)
MCHC: 29.8 g/dL — ABNORMAL LOW (ref 30.0–36.0)
MCV: 85.2 fL (ref 78.0–100.0)
Platelets: 315 10*3/uL (ref 150–400)
RBC: 3.51 MIL/uL — AB (ref 4.22–5.81)
RDW: 19.2 % — ABNORMAL HIGH (ref 11.5–15.5)
WBC: 15.8 10*3/uL — ABNORMAL HIGH (ref 4.0–10.5)

## 2017-05-23 LAB — RENAL FUNCTION PANEL
Albumin: 2.2 g/dL — ABNORMAL LOW (ref 3.5–5.0)
Anion gap: 11 (ref 5–15)
BUN: 64 mg/dL — ABNORMAL HIGH (ref 6–20)
CO2: 33 mmol/L — AB (ref 22–32)
CREATININE: 1.37 mg/dL — AB (ref 0.61–1.24)
Calcium: 9 mg/dL (ref 8.9–10.3)
Chloride: 95 mmol/L — ABNORMAL LOW (ref 101–111)
GFR calc non Af Amer: 47 mL/min — ABNORMAL LOW (ref >60)
GFR, EST AFRICAN AMERICAN: 55 mL/min — AB (ref >60)
GLUCOSE: 151 mg/dL — AB (ref 65–99)
Phosphorus: 4.1 mg/dL (ref 2.5–4.6)
Potassium: 3.5 mmol/L (ref 3.5–5.1)
Sodium: 139 mmol/L (ref 135–145)

## 2017-05-23 LAB — CULTURE, BAL-QUANTITATIVE

## 2017-05-23 LAB — BPAM RBC
BLOOD PRODUCT EXPIRATION DATE: 201903052359
Blood Product Expiration Date: 201903062359
ISSUE DATE / TIME: 201902081506
ISSUE DATE / TIME: 201902081817
UNIT TYPE AND RH: 6200
Unit Type and Rh: 6200

## 2017-05-23 LAB — TYPE AND SCREEN
ABO/RH(D): A POS
Antibody Screen: NEGATIVE
Unit division: 0
Unit division: 0

## 2017-05-23 LAB — MAGNESIUM: Magnesium: 2.2 mg/dL (ref 1.7–2.4)

## 2017-05-23 LAB — CULTURE, BAL-QUANTITATIVE W GRAM STAIN

## 2017-05-24 LAB — RENAL FUNCTION PANEL
Albumin: 2.1 g/dL — ABNORMAL LOW (ref 3.5–5.0)
Anion gap: 13 (ref 5–15)
BUN: 62 mg/dL — ABNORMAL HIGH (ref 6–20)
CALCIUM: 9.2 mg/dL (ref 8.9–10.3)
CO2: 34 mmol/L — AB (ref 22–32)
Chloride: 95 mmol/L — ABNORMAL LOW (ref 101–111)
Creatinine, Ser: 1.16 mg/dL (ref 0.61–1.24)
GFR calc non Af Amer: 58 mL/min — ABNORMAL LOW (ref >60)
Glucose, Bld: 142 mg/dL — ABNORMAL HIGH (ref 65–99)
PHOSPHORUS: 4 mg/dL (ref 2.5–4.6)
Potassium: 3.5 mmol/L (ref 3.5–5.1)
SODIUM: 142 mmol/L (ref 135–145)

## 2017-05-24 LAB — CBC
HEMATOCRIT: 29.6 % — AB (ref 39.0–52.0)
HEMOGLOBIN: 9 g/dL — AB (ref 13.0–17.0)
MCH: 26.2 pg (ref 26.0–34.0)
MCHC: 30.4 g/dL (ref 30.0–36.0)
MCV: 86.3 fL (ref 78.0–100.0)
Platelets: 333 10*3/uL (ref 150–400)
RBC: 3.43 MIL/uL — AB (ref 4.22–5.81)
RDW: 19.8 % — ABNORMAL HIGH (ref 11.5–15.5)
WBC: 14.7 10*3/uL — ABNORMAL HIGH (ref 4.0–10.5)

## 2017-05-24 LAB — MAGNESIUM: Magnesium: 2.3 mg/dL (ref 1.7–2.4)

## 2017-05-25 LAB — BASIC METABOLIC PANEL
Anion gap: 12 (ref 5–15)
BUN: 46 mg/dL — AB (ref 6–20)
CALCIUM: 9.3 mg/dL (ref 8.9–10.3)
CO2: 36 mmol/L — AB (ref 22–32)
CREATININE: 1.01 mg/dL (ref 0.61–1.24)
Chloride: 97 mmol/L — ABNORMAL LOW (ref 101–111)
GFR calc Af Amer: >60 mL/min (ref >60)
GFR calc non Af Amer: >60 mL/min (ref >60)
GLUCOSE: 144 mg/dL — AB (ref 65–99)
Potassium: 3.2 mmol/L — ABNORMAL LOW (ref 3.5–5.1)
Sodium: 145 mmol/L (ref 135–145)

## 2017-05-26 LAB — CBC
HCT: 31.1 % — ABNORMAL LOW (ref 39.0–52.0)
HEMOGLOBIN: 8.9 g/dL — AB (ref 13.0–17.0)
MCH: 25.2 pg — ABNORMAL LOW (ref 26.0–34.0)
MCHC: 28.6 g/dL — ABNORMAL LOW (ref 30.0–36.0)
MCV: 88.1 fL (ref 78.0–100.0)
PLATELETS: 353 10*3/uL (ref 150–400)
RBC: 3.53 MIL/uL — AB (ref 4.22–5.81)
RDW: 19.4 % — ABNORMAL HIGH (ref 11.5–15.5)
WBC: 13.9 10*3/uL — ABNORMAL HIGH (ref 4.0–10.5)

## 2017-05-26 LAB — BASIC METABOLIC PANEL
ANION GAP: 14 (ref 5–15)
BUN: 39 mg/dL — ABNORMAL HIGH (ref 6–20)
CALCIUM: 8.8 mg/dL — AB (ref 8.9–10.3)
CO2: 35 mmol/L — AB (ref 22–32)
CREATININE: 1.01 mg/dL (ref 0.61–1.24)
Chloride: 96 mmol/L — ABNORMAL LOW (ref 101–111)
Glucose, Bld: 190 mg/dL — ABNORMAL HIGH (ref 65–99)
Potassium: 3.7 mmol/L (ref 3.5–5.1)
SODIUM: 145 mmol/L (ref 135–145)

## 2017-06-10 LAB — CULTURE, FUNGUS WITHOUT SMEAR

## 2017-06-12 DEATH — deceased

## 2017-06-30 LAB — ACID FAST CULTURE WITH REFLEXED SENSITIVITIES (MYCOBACTERIA): Acid Fast Culture: NEGATIVE

## 2018-10-26 IMAGING — DX DG CHEST 1V PORT
1 series · 1 of 1 positions shown · non-contrast
Comparison: 05/21/2017

CLINICAL DATA: Respiratory failure.  Tracheostomy.

EXAM:
PORTABLE CHEST 1 VIEW

[chest ap]
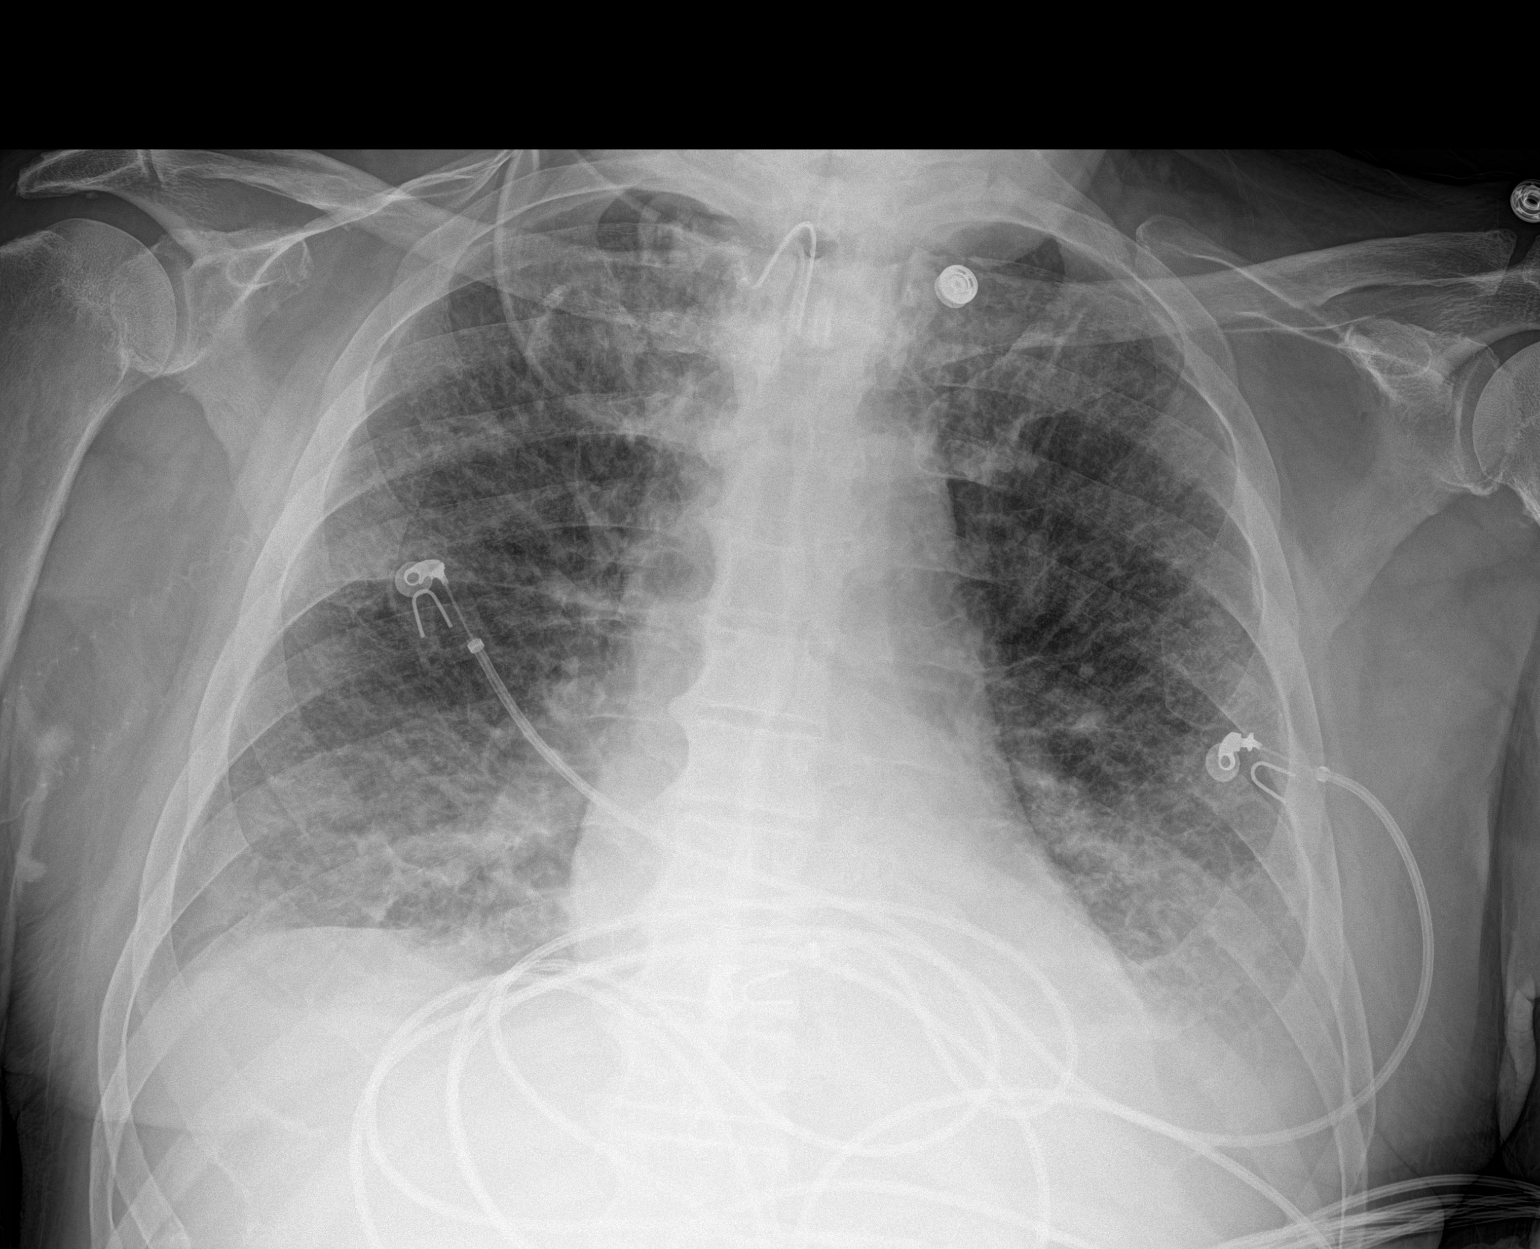

[1 of 1 positions shown; findings below may reference images not displayed]

FINDINGS: Cardiomediastinal silhouette is unchanged.

A tracheostomy tube is again noted.

Bilateral interstitial opacities/edema are unchanged. Bilateral
pleural effusions are again noted.

There is no evidence of pneumothorax.
IMPRESSION: Unchanged appearance of the chest with pulmonary edema and bilateral
pleural effusions.
